# Patient Record
Sex: Male | Born: 2000 | Race: Black or African American | Hispanic: No | Marital: Single | Smoking: Never smoker
Health system: Southern US, Community
[De-identification: ages and names within clinical notes are randomized; demographics above are authoritative.]

## PROBLEM LIST (undated history)

## (undated) DIAGNOSIS — I350 Nonrheumatic aortic (valve) stenosis: Secondary | ICD-10-CM

## (undated) DIAGNOSIS — E119 Type 2 diabetes mellitus without complications: Secondary | ICD-10-CM

## (undated) DIAGNOSIS — F909 Attention-deficit hyperactivity disorder, unspecified type: Secondary | ICD-10-CM

## (undated) HISTORY — DX: Attention-deficit hyperactivity disorder, unspecified type: F90.9

## (undated) HISTORY — DX: Nonrheumatic aortic (valve) stenosis: I35.0

## (undated) HISTORY — PX: CARDIAC SURGERY: SHX584

---
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
## 2021-01-05 ENCOUNTER — Other Ambulatory Visit: Payer: Self-pay

## 2021-01-05 ENCOUNTER — Ambulatory Visit (HOSPITAL_COMMUNITY): Payer: Medicaid Other | Attending: General Surgery | Admitting: Physical Therapy

## 2021-01-05 ENCOUNTER — Encounter (HOSPITAL_COMMUNITY): Payer: Self-pay | Admitting: Physical Therapy

## 2021-01-05 DIAGNOSIS — R2689 Other abnormalities of gait and mobility: Secondary | ICD-10-CM | POA: Insufficient documentation

## 2021-01-05 DIAGNOSIS — R262 Difficulty in walking, not elsewhere classified: Secondary | ICD-10-CM | POA: Insufficient documentation

## 2021-01-05 DIAGNOSIS — M6281 Muscle weakness (generalized): Secondary | ICD-10-CM | POA: Insufficient documentation

## 2021-01-05 DIAGNOSIS — R29898 Other symptoms and signs involving the musculoskeletal system: Secondary | ICD-10-CM | POA: Diagnosis not present

## 2021-01-05 DIAGNOSIS — M25552 Pain in left hip: Secondary | ICD-10-CM | POA: Diagnosis not present

## 2021-01-05 NOTE — Therapy (Signed)
[facility] [address] [place], [place], [address] Phone: [phone]   Fax:  [phone]  Physical Therapy Treatment  Patient Details  Name: Travis Arias MRN: [id number] Date of Birth: 06/16/2000 Referring Provider (PT): Truitt Merle   Encounter Date: 01/05/2021   PT End of Session - 01/05/21 1131     Visit Number 11    Number of Visits 16    Date for PT Re-Evaluation 01/14/21    Authorization Type [employer]    Authorization Time Period 16 visits requested 6/16-8/11/22    Authorization - Visit Number 11    Authorization - Number of Visits 16    Progress Note Due on Visit 19    PT Start Time 1131    PT Stop Time 1210    PT Time Calculation (min) 39 min    Activity Tolerance Patient tolerated treatment well    Behavior During Therapy [facility] for tasks assessed/performed             Past Medical History:  Diagnosis Date   ADHD (attention deficit hyperactivity disorder)    Aortic stenosis    Diabetes mellitus without complication [facility])     Past Surgical History:  Procedure Laterality Date   CARDIAC SURGERY     ORIF ACETABULAR FRACTURE Left 09/04/2020   Procedure: OPEN REDUCTION INTERNAL FIXATION (ORIF) ACETABULAR FRACTURE;  Surgeon: Roby Lofts, MD;  Location: [facility] OR;  Service: Orthopedics;  Laterality: Left;    There were no vitals filed for this visit.   Subjective Assessment - 01/05/21 1132     Subjective Patient states pain in hip following squats with soreness following in glute. He hasnt done squats yet.    Currently in Pain? No/denies                               [facility] Adult PT Treatment/Exercise - 01/05/21 0001       Knee/Hip Exercises: Aerobic   Tread Mill x 3 min 1.2 mph for warm-up incline 2%      Knee/Hip Exercises: Standing   Forward Lunges Both;10 reps;1 set    Lateral Step Up Both;15 reps;Hand Hold: 0;Step Height: 6";2 sets    Lateral Step Up Limitations slow eccentric,  toes forward    Forward Step Up Left;3 sets;10 reps;Step Height: 6";Right    Forward Step Up Limitations contralateral knee drive    Other Standing Knee Exercises lateral stepping with anchored pull 10 # bilateral - cues to stay upright and to take smalled steps 2 sets of 5      Knee/Hip Exercises: Seated   Other Seated Knee/Hip Exercises hip IR iso with belt 5x 5 second holds 2 sets                    PT Education - 01/05/21 1132     Education Details HEP    Person(s) Educated Patient    Methods Explanation    Comprehension Verbalized understanding              PT Short Term Goals - 12/29/20 1148       PT SHORT TERM GOAL #1   Title Patient will be independent in self management strategies to improve quality of life and functional outcomes.    Time 4    Period Weeks    Status On-going    Target Date 12/17/20      PT SHORT  TERM GOAL #2   Title Patient will report at least 25% improvement in symptoms for improved quality of life.    Baseline 75% improvement    Time 4    Period Weeks    Status Achieved    Target Date 12/17/20      PT SHORT TERM GOAL #3   Title Patient will be able to demosntrate at least 30 degress of left hip IR in prone    Baseline 35 degrees left hip IR    Time 4    Period Weeks    Status Achieved    Target Date 12/17/20               PT Long Term Goals - 12/29/20 1150       PT LONG TERM GOAL #1   Title Patient will report at least 75% improvement in symptoms for improved quality of life.    Baseline 75% improvement    Time 8    Period Weeks    Status Achieved      PT LONG TERM GOAL #2   Title Patient will be able to stand on either leg without UE support for at least 10 seconds    Baseline 6 sec LLE SLS    Time 8    Period Weeks    Status On-going      PT LONG TERM GOAL #3   Title Patient will be able to ambulate for at least 60 minutes with pain no greater than 1/10 in order to demonstrate improved ability to  ambulate in the community.    Baseline Pt notes improved activity tolerance and able to spend several hours out in the community    Time 8    Period Weeks    Status Achieved                   Plan - 01/05/21 1131     Clinical Impression Statement Patient begins with ambulation on treadmill with incline for warm up. Patient requires unilateral UE support with step up with knee drive for impaired balance. Patient showing impaired eccentric strength and motor control on LLE with lateral step down exercise despite cueing. Patient overall progressing well with PT. Patient will continue to benefit from skilled physical therapy in order to reduce impairment and improve function.    Personal Factors and Comorbidities Fitness;Comorbidity 2;Past/Current Experience;Behavior Pattern;Time since onset of injury/illness/exacerbation    Comorbidities Obesity, sedentary    Examination-Activity Limitations Locomotion Level;Transfers;Bed Mobility;Sit;Sleep;Squat;Stairs;Stand;Lift;Dressing;Carry;Bend    Examination-Participation Restrictions Cleaning;Meal Prep;Occupation;Church;Community Activity;Shop;Volunteer;Yard Work    Stability/Clinical Decision Making Stable/Uncomplicated    Rehab Potential Good    PT Frequency 2x / week    PT Duration 8 weeks    PT Treatment/Interventions ADLs/Self Care Home Management;Aquatic Therapy;Cryotherapy;Electrical Stimulation;Moist Heat;Iontophoresis 4mg /ml Dexamethasone;Ultrasound;Traction;DME Instruction;Gait training;Stair training;Functional mobility training;Therapeutic activities;Therapeutic exercise;Balance training;Patient/family education;Neuromuscular re-education;Manual techniques;Manual lymph drainage;Compression bandaging;Scar mobilization;Passive range of motion;Dry needling;Energy conservation;Splinting;Taping;Vasopneumatic Device;Spinal Manipulations;Joint Manipulations    PT Next Visit Plan work on hip IR ROM and strengthening.  Progress glute  strengthening to increase stance time on Lt with ambulation.    PT Home Exercise Plan 6/16 butterfly stretch, Hip IR ROM prone, SKC hip flexion stretch; 7/7 quad circles, bridge with band, bent knee extension prone. forward step-ups with knee drive, lateral step-ups    Consulted and Agree with Plan of Care Patient             Patient will benefit from skilled therapeutic intervention in order to improve the following  deficits and impairments:  Abnormal gait, Difficulty walking, Decreased endurance, Decreased activity tolerance, Decreased knowledge of precautions, Pain, Impaired perceived functional ability, Decreased balance, Impaired flexibility, Improper body mechanics, Decreased strength, Decreased mobility  Visit Diagnosis: Pain in left hip  Difficulty in walking, not elsewhere classified  Other abnormalities of gait and mobility  Other symptoms and signs involving the musculoskeletal system  Muscle weakness (generalized)     Problem List Patient Active Problem List   Diagnosis Date Noted   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 [place], [place], [address] Phone: [phone]   Fax:  [phone]  Name: Travis Arias MRN: [id number] Date of Birth: January 09, 2001

## 2021-01-07 ENCOUNTER — Ambulatory Visit (HOSPITAL_COMMUNITY): Payer: Medicaid Other

## 2021-01-07 DIAGNOSIS — S32409A Unspecified fracture of unspecified acetabulum, initial encounter for closed fracture: Secondary | ICD-10-CM | POA: Diagnosis not present

## 2021-01-07 DIAGNOSIS — R531 Weakness: Secondary | ICD-10-CM | POA: Diagnosis not present

## 2021-01-08 ENCOUNTER — Encounter (HOSPITAL_COMMUNITY): Payer: Self-pay

## 2021-01-08 ENCOUNTER — Other Ambulatory Visit: Payer: Self-pay

## 2021-01-08 ENCOUNTER — Ambulatory Visit (HOSPITAL_COMMUNITY): Payer: Medicaid Other

## 2021-01-08 DIAGNOSIS — R29898 Other symptoms and signs involving the musculoskeletal system: Secondary | ICD-10-CM | POA: Diagnosis not present

## 2021-01-08 DIAGNOSIS — R2689 Other abnormalities of gait and mobility: Secondary | ICD-10-CM | POA: Diagnosis not present

## 2021-01-08 DIAGNOSIS — M6281 Muscle weakness (generalized): Secondary | ICD-10-CM | POA: Diagnosis not present

## 2021-01-08 DIAGNOSIS — R262 Difficulty in walking, not elsewhere classified: Secondary | ICD-10-CM | POA: Diagnosis not present

## 2021-01-08 DIAGNOSIS — M25552 Pain in left hip: Secondary | ICD-10-CM | POA: Diagnosis not present

## 2021-01-08 NOTE — Therapy (Signed)
[facility] [address] [place], [place], [address] Phone: [phone]   Fax:  [phone]  Physical Therapy Treatment  Patient Details  Name: WOODROE VOGAN MRN: [id number] Date of Birth: 2001-05-27 Referring Provider (PT): Truitt Merle   Encounter Date: 01/08/2021   PT End of Session - 01/08/21 1336     Visit Number 12    Number of Visits 16    Date for PT Re-Evaluation 01/14/21    Authorization Type [employer]    Authorization Time Period 16 visits requested 6/16-8/11/22    Authorization - Visit Number 12    Authorization - Number of Visits 16    Progress Note Due on Visit 19    PT Start Time 1317    PT Stop Time 1355    PT Time Calculation (min) 38 min    Activity Tolerance Patient tolerated treatment well;Patient limited by fatigue    Behavior During Therapy [facility] for tasks assessed/performed             Past Medical History:  Diagnosis Date   ADHD (attention deficit hyperactivity disorder)    Aortic stenosis    Diabetes mellitus without complication [facility])     Past Surgical History:  Procedure Laterality Date   CARDIAC SURGERY     ORIF ACETABULAR FRACTURE Left 09/04/2020   Procedure: OPEN REDUCTION INTERNAL FIXATION (ORIF) ACETABULAR FRACTURE;  Surgeon: Roby Lofts, MD;  [facility]: [facility] OR;  Service: Orthopedics;  Laterality: Left;    There were no vitals filed for this visit.   Subjective Assessment - 01/08/21 1324     Subjective Feeling good today, no reports of pain today.    Patient Stated Goals like to walk better    Currently in Pain? No/denies                               [facility] Adult PT Treatment/Exercise - 01/08/21 0001       Ambulation/Gait   Ambulation/Gait Yes    Ambulation/Gait Assistance 6: Modified independent (Device/Increase time)    Ambulation Distance (Feet) 376 Feet    Assistive device None    Gait Pattern Decreased stance time - left;Decreased stride  length;Decreased hip/knee flexion - left;Left foot flat;Trendelenburg    Ambulation Surface Level;Indoor    Gait Comments      Knee/Hip Exercises: Standing   Heel Raises 20 reps    Heel Raises Limitations controlled lowering minimal hand use with cueing for ab set    Forward Lunges Both;10 reps;1 set    Forward Lunges Limitations floor wiht ab set minimal hand use    Lateral Step Up Both;2 sets;10 reps;Hand Hold: 1;Step Height: 6";Step Height: 4"    Lateral Step Up Limitations slow eccentric, toes forward    Forward Step Up Left;2 sets;10 reps    Forward Step Up Limitations contralateral knee drive; cueing to reduce hyperextension knee joint    Functional Squat 2 sets;5 reps    Functional Squat Limitations controlled STS front of mirror for equal weight bearing then walk around step for IR 10" holds    Other Standing Knee Exercises lateral stepping with anchored pull 10 # bilateral - cues to stay upright and to take smalled steps 2 sets of 5    Other Standing Knee Exercises crossover step up 6in 10x  PT Short Term Goals - 12/29/20 1148       PT SHORT TERM GOAL #1   Title Patient will be independent in self management strategies to improve quality of life and functional outcomes.    Time 4    Period Weeks    Status On-going    Target Date 12/17/20      PT SHORT TERM GOAL #2   Title Patient will report at least 25% improvement in symptoms for improved quality of life.    Baseline 75% improvement    Time 4    Period Weeks    Status Achieved    Target Date 12/17/20      PT SHORT TERM GOAL #3   Title Patient will be able to demosntrate at least 30 degress of left hip IR in prone    Baseline 35 degrees left hip IR    Time 4    Period Weeks    Status Achieved    Target Date 12/17/20               PT Long Term Goals - 12/29/20 1150       PT LONG TERM GOAL #1   Title Patient will report at least 75% improvement in symptoms for  improved quality of life.    Baseline 75% improvement    Time 8    Period Weeks    Status Achieved      PT LONG TERM GOAL #2   Title Patient will be able to stand on either leg without UE support for at least 10 seconds    Baseline 6 sec LLE SLS    Time 8    Period Weeks    Status On-going      PT LONG TERM GOAL #3   Title Patient will be able to ambulate for at least 60 minutes with pain no greater than 1/10 in order to demonstrate improved ability to ambulate in the community.    Baseline Pt notes improved activity tolerance and able to spend several hours out in the community    Time 8    Period Weeks    Status Achieved                   Plan - 01/08/21 1336     Clinical Impression Statement Began session with with increased cadence and improve weight distribution during gait.  Added walk around exercise for IR that was tolerated well.  STS complete infront of mirror to improve awareness with weight distrution with task with min verbal cueing.  Cueing through session for controlled movements, reduce heavy UE support, demonstration and verbal cueing for mechanics and to improve awareness of knee hyperextension during forward and lateral step up training.  EOS pt limited by fatigue, no reports of pain.    Personal Factors and Comorbidities Fitness;Comorbidity 2;Past/Current Experience;Behavior Pattern;Time since onset of injury/illness/exacerbation    Comorbidities Obesity, sedentary    Examination-Activity Limitations Locomotion Level;Transfers;Bed Mobility;Sit;Sleep;Squat;Stairs;Stand;Lift;Dressing;Carry;Bend    Examination-Participation Restrictions Cleaning;Meal Prep;Occupation;Church;Community Activity;Shop;Volunteer;Yard Work    Stability/Clinical Decision Making Stable/Uncomplicated    Optometrist Low    Rehab Potential Good    PT Frequency 2x / week    PT Duration 8 weeks    PT Treatment/Interventions ADLs/Self Care Home Management;Aquatic  Therapy;Cryotherapy;Electrical Stimulation;Moist Heat;Iontophoresis 4mg /ml Dexamethasone;Ultrasound;Traction;DME Instruction;Gait training;Stair training;Functional mobility training;Therapeutic activities;Therapeutic exercise;Balance training;Patient/family education;Neuromuscular re-education;Manual techniques;Manual lymph drainage;Compression bandaging;Scar mobilization;Passive range of motion;Dry needling;Energy conservation;Splinting;Taping;Vasopneumatic Device;Spinal Manipulations;Joint Manipulations    PT Next Visit Plan  work on hip IR ROM and strengthening.  Progress glute strengthening to increase stance time on Lt with ambulation.    PT Home Exercise Plan 6/16 butterfly stretch, Hip IR ROM prone, SKC hip flexion stretch; 7/7 quad circles, bridge with band, bent knee extension prone. forward step-ups with knee drive, lateral step-ups    Consulted and Agree with Plan of Care Patient             Patient will benefit from skilled therapeutic intervention in order to improve the following deficits and impairments:  Abnormal gait, Difficulty walking, Decreased endurance, Decreased activity tolerance, Decreased knowledge of precautions, Pain, Impaired perceived functional ability, Decreased balance, Impaired flexibility, Improper body mechanics, Decreased strength, Decreased mobility  Visit Diagnosis: Pain in left hip  Difficulty in walking, not elsewhere classified  Other abnormalities of gait and mobility  Other symptoms and signs involving the musculoskeletal system  Muscle weakness (generalized)     Problem List Patient Active Problem List   Diagnosis Date Noted   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

## 2021-01-12 ENCOUNTER — Encounter (HOSPITAL_COMMUNITY): Payer: Self-pay | Admitting: Physical Therapy

## 2021-01-12 ENCOUNTER — Ambulatory Visit (HOSPITAL_COMMUNITY): Payer: Medicaid Other | Admitting: Physical Therapy

## 2021-01-12 ENCOUNTER — Other Ambulatory Visit: Payer: Self-pay

## 2021-01-12 DIAGNOSIS — R29898 Other symptoms and signs involving the musculoskeletal system: Secondary | ICD-10-CM

## 2021-01-12 DIAGNOSIS — M6281 Muscle weakness (generalized): Secondary | ICD-10-CM | POA: Diagnosis not present

## 2021-01-12 DIAGNOSIS — R2689 Other abnormalities of gait and mobility: Secondary | ICD-10-CM

## 2021-01-12 DIAGNOSIS — R262 Difficulty in walking, not elsewhere classified: Secondary | ICD-10-CM | POA: Diagnosis not present

## 2021-01-12 DIAGNOSIS — M25552 Pain in left hip: Secondary | ICD-10-CM | POA: Diagnosis not present
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
## 2021-01-14 ENCOUNTER — Other Ambulatory Visit: Payer: Self-pay

## 2021-01-14 ENCOUNTER — Ambulatory Visit (HOSPITAL_COMMUNITY): Payer: Medicaid Other | Admitting: Physical Therapy

## 2021-01-14 ENCOUNTER — Encounter (HOSPITAL_COMMUNITY): Payer: Self-pay | Admitting: Physical Therapy

## 2021-01-14 DIAGNOSIS — M25552 Pain in left hip: Secondary | ICD-10-CM | POA: Diagnosis not present

## 2021-01-14 DIAGNOSIS — R2689 Other abnormalities of gait and mobility: Secondary | ICD-10-CM | POA: Diagnosis not present

## 2021-01-14 DIAGNOSIS — R29898 Other symptoms and signs involving the musculoskeletal system: Secondary | ICD-10-CM | POA: Diagnosis not present

## 2021-01-14 DIAGNOSIS — R262 Difficulty in walking, not elsewhere classified: Secondary | ICD-10-CM

## 2021-01-14 DIAGNOSIS — M6281 Muscle weakness (generalized): Secondary | ICD-10-CM | POA: Diagnosis not present
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
## 2021-02-07 DIAGNOSIS — R531 Weakness: Secondary | ICD-10-CM | POA: Diagnosis not present

## 2021-02-07 DIAGNOSIS — S32409A Unspecified fracture of unspecified acetabulum, initial encounter for closed fracture: Secondary | ICD-10-CM | POA: Diagnosis not present

## 2021-03-09 DIAGNOSIS — S32409A Unspecified fracture of unspecified acetabulum, initial encounter for closed fracture: Secondary | ICD-10-CM | POA: Diagnosis not present

## 2021-03-09 DIAGNOSIS — R531 Weakness: Secondary | ICD-10-CM | POA: Diagnosis not present

## 2021-03-30 DIAGNOSIS — S32452D Displaced transverse fracture of left acetabulum, subsequent encounter for fracture with routine healing: Secondary | ICD-10-CM | POA: Diagnosis not present

## 2021-04-09 DIAGNOSIS — R531 Weakness: Secondary | ICD-10-CM | POA: Diagnosis not present

## 2021-04-09 DIAGNOSIS — S32409A Unspecified fracture of unspecified acetabulum, initial encounter for closed fracture: Secondary | ICD-10-CM | POA: Diagnosis not present

## 2021-05-09 DIAGNOSIS — S32409A Unspecified fracture of unspecified acetabulum, initial encounter for closed fracture: Secondary | ICD-10-CM | POA: Diagnosis not present

## 2021-05-09 DIAGNOSIS — R531 Weakness: Secondary | ICD-10-CM | POA: Diagnosis not present

## 2021-06-09 DIAGNOSIS — S32409A Unspecified fracture of unspecified acetabulum, initial encounter for closed fracture: Secondary | ICD-10-CM | POA: Diagnosis not present

## 2021-06-09 DIAGNOSIS — R531 Weakness: Secondary | ICD-10-CM | POA: Diagnosis not present

## 2021-07-10 DIAGNOSIS — R531 Weakness: Secondary | ICD-10-CM | POA: Diagnosis not present

## 2021-07-10 DIAGNOSIS — S32409A Unspecified fracture of unspecified acetabulum, initial encounter for closed fracture: Secondary | ICD-10-CM | POA: Diagnosis not present

## 2021-07-13 DIAGNOSIS — R070 Pain in throat: Secondary | ICD-10-CM | POA: Diagnosis not present

## 2021-07-13 DIAGNOSIS — R42 Dizziness and giddiness: Secondary | ICD-10-CM | POA: Diagnosis not present

## 2021-08-26 ENCOUNTER — Ambulatory Visit (INDEPENDENT_AMBULATORY_CARE_PROVIDER_SITE_OTHER): Payer: Medicaid Other | Admitting: Family Medicine

## 2021-08-26 ENCOUNTER — Other Ambulatory Visit: Payer: Self-pay

## 2021-08-26 VITALS — BP 122/80 | HR 91 | Temp 98.6°F | Ht 67.0 in | Wt 353.2 lb

## 2021-08-26 DIAGNOSIS — D649 Anemia, unspecified: Secondary | ICD-10-CM | POA: Diagnosis not present

## 2021-08-26 DIAGNOSIS — Z13220 Encounter for screening for lipoid disorders: Secondary | ICD-10-CM

## 2021-08-26 DIAGNOSIS — E119 Type 2 diabetes mellitus without complications: Secondary | ICD-10-CM | POA: Diagnosis not present

## 2021-08-26 MED ORDER — OZEMPIC (0.25 OR 0.5 MG/DOSE) 2 MG/1.5ML ~~LOC~~ SOPN: 0.2500 mg | PEN_INJECTOR | SUBCUTANEOUS | 0 refills | Status: DC

## 2021-08-26 NOTE — Assessment & Plan Note (Signed)
History of anemia.  Needs labs. ?
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
## 2021-08-26 NOTE — Assessment & Plan Note (Signed)
Labs today.  Unsure of control. ?

## 2021-08-26 NOTE — Patient Instructions (Signed)
Labs today. ? ?Will see if we can get medication approved. ? ?I have placed a referral to nutrition. They will be in contact. ? ?Follow up in 3 months. ?

## 2021-08-26 NOTE — Assessment & Plan Note (Signed)
Needs weight loss.  Had a lengthy discussion today regarding need for dietary changes and weight loss.  Discussed weight loss medication.  Also discussed the possibility of bariatric surgery.  Referring to nutrition.  We will see if patient can get approved for semaglutide. ?

## 2021-08-27 ENCOUNTER — Telehealth: Payer: Self-pay | Admitting: Family Medicine

## 2021-08-27 LAB — CMP14+EGFR
ALT: 20 IU/L (ref 0–44)
AST: 18 IU/L (ref 0–40)
Albumin/Globulin Ratio: 1.9 (ref 1.2–2.2)
Albumin: 4.6 g/dL (ref 4.1–5.2)
Alkaline Phosphatase: 102 IU/L (ref 51–125)
BUN/Creatinine Ratio: 15 (ref 9–20)
BUN: 13 mg/dL (ref 6–20)
Bilirubin Total: 0.2 mg/dL (ref 0.0–1.2)
CO2: 24 mmol/L (ref 20–29)
Calcium: 9.8 mg/dL (ref 8.7–10.2)
Chloride: 103 mmol/L (ref 96–106)
Creatinine, Ser: 0.85 mg/dL (ref 0.76–1.27)
Globulin, Total: 2.4 g/dL (ref 1.5–4.5)
Glucose: 99 mg/dL (ref 70–99)
Potassium: 5.2 mmol/L (ref 3.5–5.2)
Sodium: 141 mmol/L (ref 134–144)
Total Protein: 7 g/dL (ref 6.0–8.5)
eGFR: 128 mL/min/{1.73_m2} (ref >59)

## 2021-08-27 LAB — LIPID PANEL
Chol/HDL Ratio: 4.2 ratio (ref 0.0–5.0)
Cholesterol, Total: 151 mg/dL (ref 100–199)
HDL: 36 mg/dL — ABNORMAL LOW (ref >39)
LDL Chol Calc (NIH): 102 mg/dL — ABNORMAL HIGH (ref 0–99)
Triglycerides: 63 mg/dL (ref 0–149)
VLDL Cholesterol Cal: 13 mg/dL (ref 5–40)

## 2021-08-27 LAB — CBC
Hematocrit: 44 % (ref 37.5–51.0)
Hemoglobin: 14.5 g/dL (ref 13.0–17.7)
MCH: 27.7 pg (ref 26.6–33.0)
MCHC: 33 g/dL (ref 31.5–35.7)
MCV: 84 fL (ref 79–97)
Platelets: 458 10*3/uL — ABNORMAL HIGH (ref 150–450)
RBC: 5.24 x10E6/uL (ref 4.14–5.80)
RDW: 13 % (ref 11.6–15.4)
WBC: 5 10*3/uL (ref 3.4–10.8)

## 2021-08-27 LAB — IRON,TIBC AND FERRITIN PANEL
Ferritin: 83 ng/mL (ref 30–400)
Iron Saturation: 30 % (ref 15–55)
Iron: 98 ug/dL (ref 38–169)
Total Iron Binding Capacity: 330 ug/dL (ref 250–450)
UIBC: 232 ug/dL (ref 111–343)

## 2021-08-27 LAB — MICROALBUMIN / CREATININE URINE RATIO
Creatinine, Urine: 255.9 mg/dL
Microalb/Creat Ratio: 5 mg/g creat (ref 0–29)
Microalbumin, Urine: 12.2 ug/mL

## 2021-08-27 LAB — HEMOGLOBIN A1C
Est. average glucose Bld gHb Est-mCnc: 131 mg/dL
Hgb A1c MFr Bld: 6.2 % — ABNORMAL HIGH (ref 4.8–5.6)

## 2021-08-27 LAB — FOLATE: Folate: 7.2 ng/mL (ref >3.0)

## 2021-08-27 LAB — VITAMIN B12: Vitamin B-12: 319 pg/mL (ref 232–1245)

## 2021-08-27 NOTE — Telephone Encounter (Signed)
Ozempic 2mg /1.80ml has been approved from 08/27/21-08/28/22.  ?

## 2021-10-04 ENCOUNTER — Ambulatory Visit: Payer: Medicaid Other | Admitting: Nutrition

## 2021-10-06 ENCOUNTER — Other Ambulatory Visit: Payer: Self-pay | Admitting: Family Medicine

## 2021-10-07 ENCOUNTER — Other Ambulatory Visit: Payer: Self-pay | Admitting: Family Medicine

## 2021-10-07 MED ORDER — OZEMPIC (0.25 OR 0.5 MG/DOSE) 2 MG/1.5ML ~~LOC~~ SOPN: 0.5000 mg | PEN_INJECTOR | SUBCUTANEOUS | 0 refills | Status: DC

## 2021-11-03 ENCOUNTER — Ambulatory Visit: Payer: Medicaid Other | Admitting: Nutrition

## 2021-11-26 ENCOUNTER — Ambulatory Visit (INDEPENDENT_AMBULATORY_CARE_PROVIDER_SITE_OTHER): Payer: Medicaid Other | Admitting: Family Medicine

## 2021-11-26 MED ORDER — SEMAGLUTIDE (1 MG/DOSE) 4 MG/3ML ~~LOC~~ SOPN: 1.0000 mg | PEN_INJECTOR | SUBCUTANEOUS | 0 refills | Status: DC

## 2021-11-29 NOTE — Assessment & Plan Note (Signed)
Advised to increase semaglutide.  Patient is to increase to 0.5 mg weekly and then subsequently to 1 mg weekly.

## 2021-12-14 ENCOUNTER — Other Ambulatory Visit: Payer: Self-pay | Admitting: Family Medicine

## 2022-01-19 ENCOUNTER — Other Ambulatory Visit: Payer: Self-pay | Admitting: Family Medicine

## 2022-01-20 NOTE — Telephone Encounter (Signed)
Left message to return call 

## 2022-02-16 ENCOUNTER — Other Ambulatory Visit: Payer: Self-pay | Admitting: Family Medicine

## 2022-02-28 ENCOUNTER — Ambulatory Visit: Payer: Medicaid Other | Admitting: Family Medicine

## 2022-03-02 ENCOUNTER — Ambulatory Visit: Payer: Medicaid Other | Admitting: Family Medicine

## 2022-03-30 ENCOUNTER — Ambulatory Visit (INDEPENDENT_AMBULATORY_CARE_PROVIDER_SITE_OTHER): Payer: Self-pay | Admitting: Family Medicine

## 2022-03-30 VITALS — BP 112/75 | HR 87 | Temp 98.2°F | Ht 67.0 in | Wt 352.0 lb

## 2022-03-30 DIAGNOSIS — Z23 Encounter for immunization: Secondary | ICD-10-CM

## 2022-03-30 DIAGNOSIS — E119 Type 2 diabetes mellitus without complications: Secondary | ICD-10-CM

## 2022-03-30 DIAGNOSIS — E785 Hyperlipidemia, unspecified: Secondary | ICD-10-CM

## 2022-03-30 MED ORDER — SEMAGLUTIDE (2 MG/DOSE) 8 MG/3ML ~~LOC~~ SOPN: 2.0000 mg | PEN_INJECTOR | SUBCUTANEOUS | 6 refills | Status: DC

## 2022-03-30 NOTE — Patient Instructions (Signed)
A1C today.  I have increased the Ozempic.  Consider weight loss surgery.  Take care  Dr. Lacinda Axon

## 2022-03-31 DIAGNOSIS — E785 Hyperlipidemia, unspecified: Secondary | ICD-10-CM | POA: Insufficient documentation

## 2022-03-31 LAB — HEMOGLOBIN A1C
Est. average glucose Bld gHb Est-mCnc: 117 mg/dL
Hgb A1c MFr Bld: 5.7 % — ABNORMAL HIGH (ref 4.8–5.6)

## 2022-03-31 NOTE — Assessment & Plan Note (Addendum)
Mildly elevated LDL.  Will likely resolve with weight loss.

## 2022-03-31 NOTE — Assessment & Plan Note (Signed)
Increase in semaglutide.  Discussed weight loss surgery.  Patient wants to wait at this time.

## 2022-03-31 NOTE — Progress Notes (Signed)
Subjective:  Patient ID: Travis Arias, male    DOB: [birth year]  Age: 21 y.o. MRN: [id number]  CC: Chief Complaint  Patient presents with   Diabetes   Obesity    HPI:  21 year old male with morbid obesity, hyperlipidemia, and type 2 diabetes presents for follow-up.  Patient has not had any significant weight loss on Ozempic.  A1c has been well controlled.  He is currently on the 1 mg dose.  We will discuss increasing today.  Patient's main health issue is morbid obesity.  Will discuss weight loss surgery today.  Hyperlipidemia is mild.  No pharmacotherapy at this time given age.  He states that he is feeling well.  He has no significant complaints or concerns at this time.  Patient Active Problem List   Diagnosis Date Noted   Hyperlipidemia 03/31/2022   Type 2 diabetes mellitus without complication, without long-term current use of insulin ([place]) 08/26/2021   Morbid obesity ([place]) 08/26/2021    Social Hx   Social History   Socioeconomic History   Marital status: Single    Spouse name: Not on file   Number of children: Not on file   Years of education: Not on file   Highest education level: Not on file  Occupational History   Not on file  Tobacco Use   Smoking status: Never   Smokeless tobacco: Never  Substance and Sexual Activity   Alcohol use: No   Drug use: No   Sexual activity: Not on file  Other Topics Concern   Not on file  Social History Narrative   Not on file   Social Determinants of Health   Financial Resource Strain: Not on file  Food Insecurity: Not on file  Transportation Needs: Not on file  Physical Activity: Not on file  Stress: Not on file  Social Connections: Not on file    Review of Systems  Constitutional: Negative.   Respiratory: Negative.    Cardiovascular: Negative.    Objective:  BP 112/75   Pulse 87   Temp 98.2 F (36.8 C)   Ht 5\' 7"  (1.702 m)   Wt (!) 352 lb (159.7 kg)   SpO2 100%   BMI 55.13 kg/m      03/30/2022     3:17 PM 11/26/2021   10:11 AM 08/26/2021    1:28 PM  BP/Weight  Systolic BP 323 557 322  Diastolic BP 75 76 80  Wt. (Lbs) 352 350 353.2  BMI 55.13 kg/m2 54.82 kg/m2 55.32 kg/m2    Physical Exam Vitals and nursing note reviewed.  Constitutional:      Appearance: Normal appearance. He is obese.  HENT:     Head: Normocephalic and atraumatic.  Cardiovascular:     Rate and Rhythm: Normal rate and regular rhythm.  Pulmonary:     Effort: Pulmonary effort is normal.     Breath sounds: Normal breath sounds. No wheezing, rhonchi or rales.  Neurological:     Mental Status: He is alert.    Lab Results  Component Value Date   WBC 5.0 08/26/2021   HGB 14.5 08/26/2021   HCT 44.0 08/26/2021   PLT 458 (H) 08/26/2021   GLUCOSE 99 08/26/2021   CHOL 151 08/26/2021   TRIG 63 08/26/2021   HDL 36 (L) 08/26/2021   LDLCALC 102 (H) 08/26/2021   ALT 20 08/26/2021   AST 18 08/26/2021   NA 141 08/26/2021   K 5.2 08/26/2021   CL 103 08/26/2021   CREATININE 0.85  08/26/2021   BUN 13 08/26/2021   CO2 24 08/26/2021   TSH 1.650 07/31/2019   HGBA1C 5.7 (H) 03/30/2022     Assessment & Plan:   Problem List Items Addressed This Visit       Endocrine   Type 2 diabetes mellitus without complication, without long-term current use of insulin ([place]) - Primary    Well-controlled.  A1c was obtained and was 5.7. Increasing semaglutide to help with weight loss.      Relevant Medications   Semaglutide, 2 MG/DOSE, 8 MG/3ML SOPN   Other Relevant Orders   Hemoglobin A1c (Completed)     Other   Morbid obesity (HCC)    Increase in semaglutide.  Discussed weight loss surgery.  Patient wants to wait at this time.      Relevant Medications   Semaglutide, 2 MG/DOSE, 8 MG/3ML SOPN   Hyperlipidemia    Mildly elevated LDL.  Will likely resolve with weight loss.      Other Visit Diagnoses     Immunization due       Relevant Orders   Flu Vaccine QUAD 53mo+IM (Fluarix, Fluzone & Alfiuria Quad PF)  (Completed)       Meds ordered this encounter  Medications   Semaglutide, 2 MG/DOSE, 8 MG/3ML SOPN    Sig: Inject 2 mg as directed once a week.    Dispense:  3 mL    Refill:  [place]

## 2022-03-31 NOTE — Assessment & Plan Note (Signed)
Well-controlled.  A1c was obtained and was 5.7. Increasing semaglutide to help with weight loss.

## 2022-07-11 ENCOUNTER — Telehealth: Payer: Self-pay | Admitting: *Deleted

## 2022-07-11 NOTE — Telephone Encounter (Signed)
New insurance hs still not approved Ozempic and patient has been without med for 5 or more weeks- mom (DPR) would like to know if there is something else you would recommend that they may cover that would help with diabetes and weight  [employer]

## 2022-07-11 NOTE — Telephone Encounter (Signed)
Coral Spikes, DO     I can try another GLP 1. I would recommend that he consider bariatric surgery as well.  Dr. Lacinda Axon

## 2022-07-12 ENCOUNTER — Other Ambulatory Visit: Payer: Self-pay | Admitting: Family Medicine

## 2022-07-12 MED ORDER — TRULICITY 3 MG/0.5ML ~~LOC~~ SOAJ: 3.0000 mg | SUBCUTANEOUS | 0 refills | Status: DC

## 2022-07-13 ENCOUNTER — Telehealth: Payer: Self-pay | Admitting: Family Medicine

## 2022-07-13 NOTE — Telephone Encounter (Signed)
Contacted mom to see if she wanted [place] to complete PA for Ozempic or Mounjaro. Mom states we can do whichever PA; Trulicity has been sent in but needing PA also. PA for Trulicity has been attempted; await decision from insurance

## 2022-07-13 NOTE — Telephone Encounter (Signed)
PA for Trulicity completed through [email or web address]. ID number for PA [id number]. Await decision from insurance

## 2022-07-13 NOTE — Telephone Encounter (Signed)
Pt mom called and was wanting to know if Darcel Bayley was covered. Per phone message on 11/11/01 Trulicity was sent in. Mom states that she is on [facility] and has same issues so it should be covered. Advised mom that we would try again through [facility] and let her know determination. Mom states if she does not answer we can leave voicemail.

## 2022-07-13 NOTE — Telephone Encounter (Signed)
Coral Spikes, DO     Rx sent for Trulicity.    Mother(DPR) aware

## 2022-07-21 NOTE — Telephone Encounter (Signed)
Sent mychart message to patient

## 2022-07-21 NOTE — Telephone Encounter (Signed)
Additional information sent yesterday

## 2022-07-21 NOTE — Telephone Encounter (Signed)
Trulicity has been denied per insurance. Please advise. Thank you

## 2022-07-22 IMAGING — RF DG C-ARM 1-60 MIN
1 series · 15 of 18 positions shown · non-contrast
Comparison: None.

CLINICAL DATA: ORIF acetabular fracture.

EXAM:
DG C-ARM 1-60 MIN; JUDET PELVIS - 3+ VIEW
FLUOROSCOPY TIME:  Fluoroscopy Time:  337.4 seconds.
Radiation Exposure Index (if provided by the fluoroscopic device):
477 mGy.
Number of Acquired Spot Images: 18 images.

[Series 1: run · 15 of 18 slices shown]
[im 1/18]
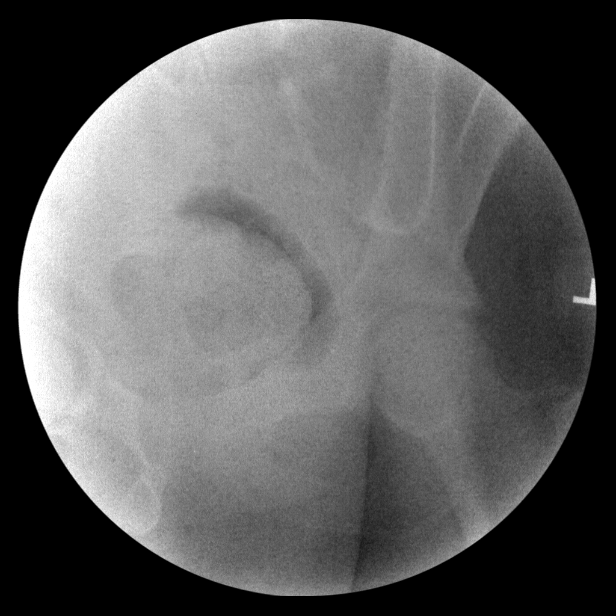
[im 2/18]
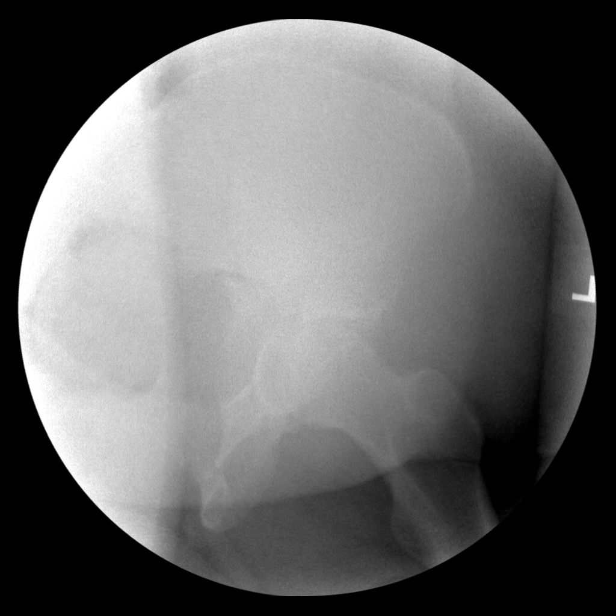
[im 4/18]
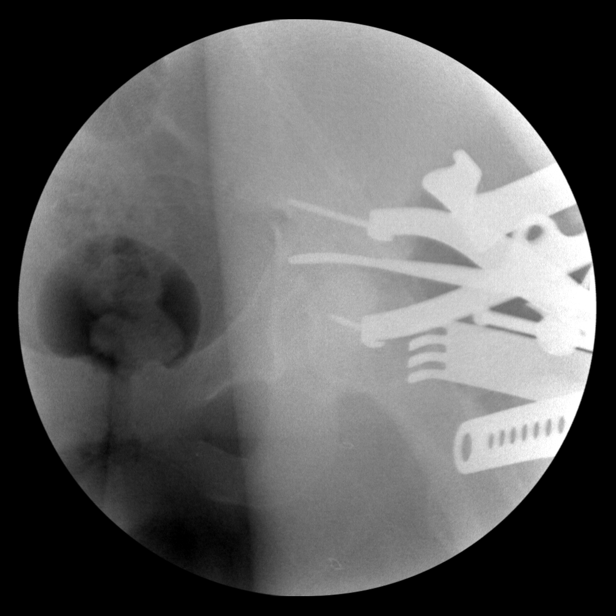
[im 5/18]
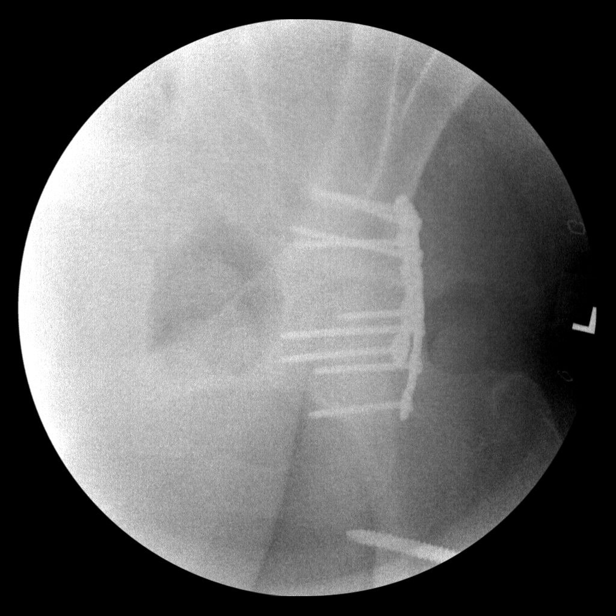
[im 6/18]
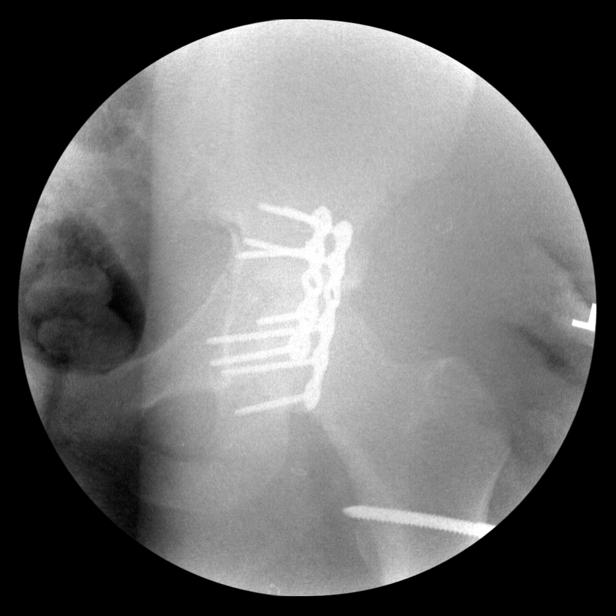
[im 7/18]
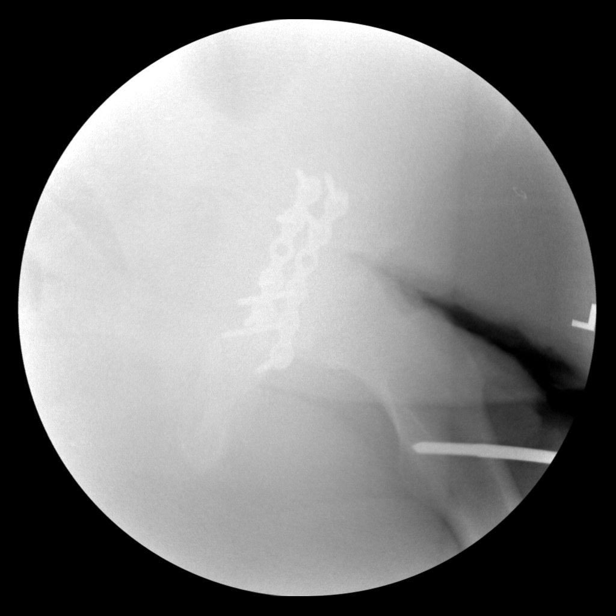
[im 8/18]
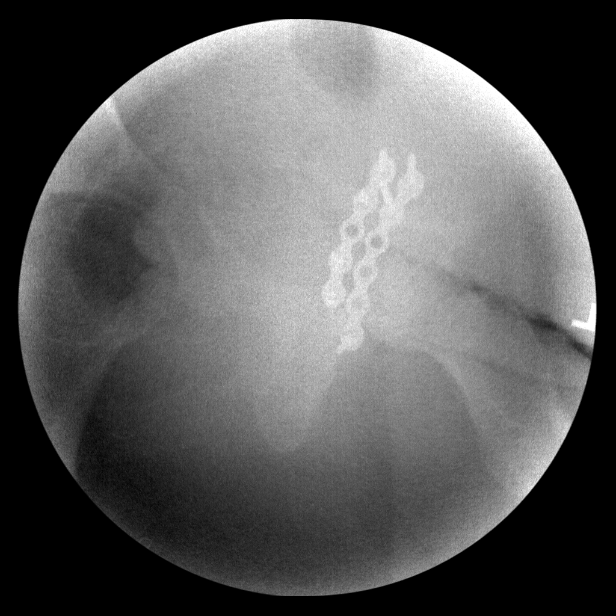
[im 10/18]
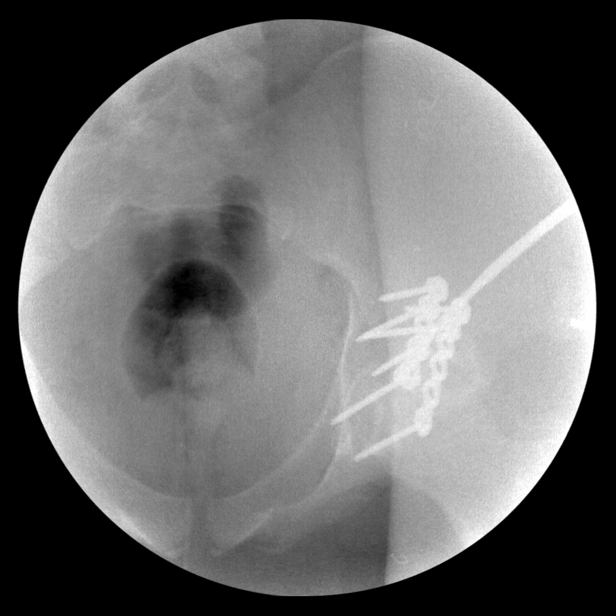
[im 11/18]
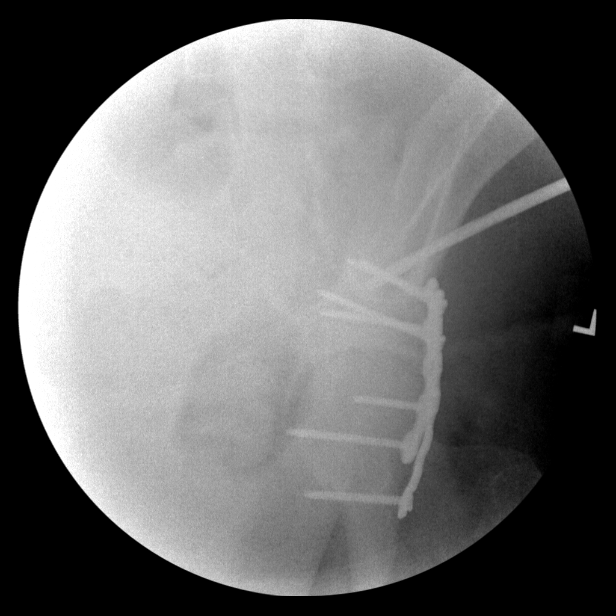
[im 12/18]
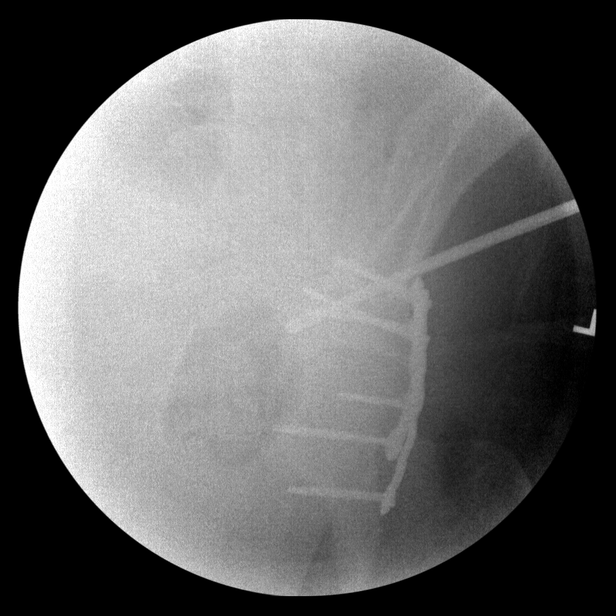
[im 13/18]
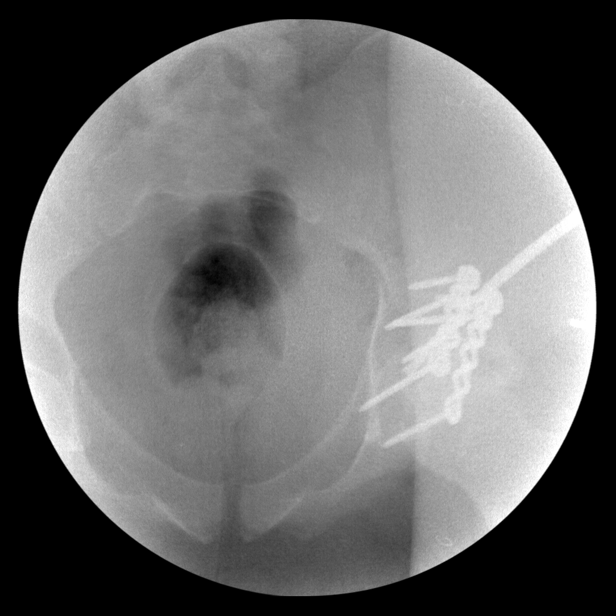
[im 14/18]
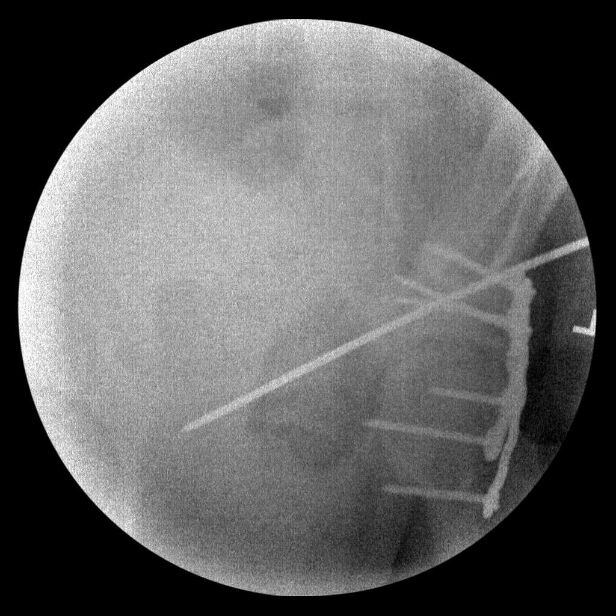
[im 16/18]
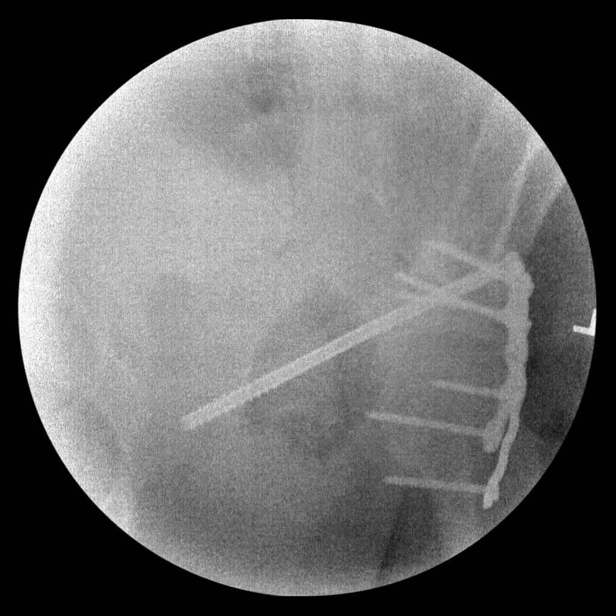
[im 17/18]
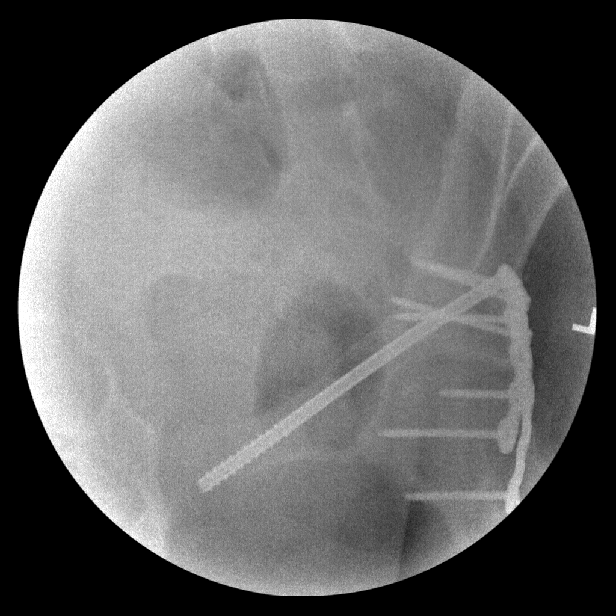
[im 18/18]
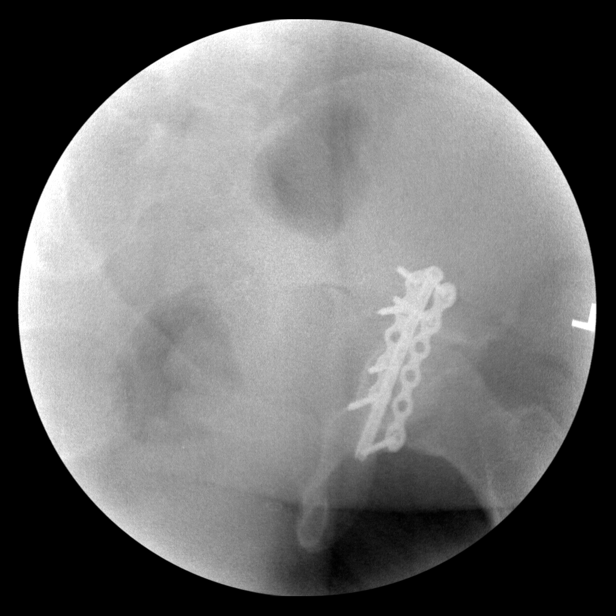

[15 of 18 positions shown; findings below may reference images not displayed]

FINDINGS: Eighteen C-arm fluoroscopic images were obtained intraoperatively
and submitted for post operative interpretation. These images
demonstrate fixation of a left acetabular fracture with plate and
screws along the posterior column and placement of an anterior
column screw. Please see the performing provider's procedural report
for further detail.
IMPRESSION: Intraoperative fluoroscopy, as detailed above.

## 2022-07-22 IMAGING — RF DG PELVIS 3+V JUDET
1 series · 15 of 18 positions shown · non-contrast
Comparison: None.

CLINICAL DATA: ORIF acetabular fracture.

EXAM:
DG C-ARM 1-60 MIN; JUDET PELVIS - 3+ VIEW
FLUOROSCOPY TIME:  Fluoroscopy Time:  337.4 seconds.
Radiation Exposure Index (if provided by the fluoroscopic device):
477 mGy.
Number of Acquired Spot Images: 18 images.

[Series 1: run · 15 of 18 slices shown]
[im 1/18]
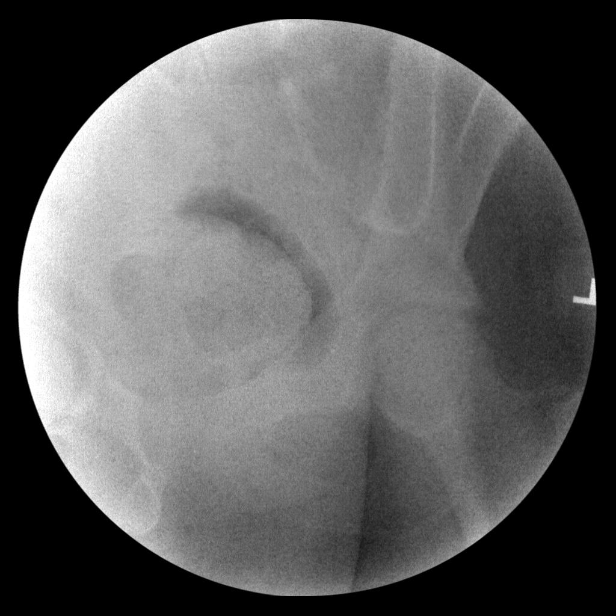
[im 2/18]
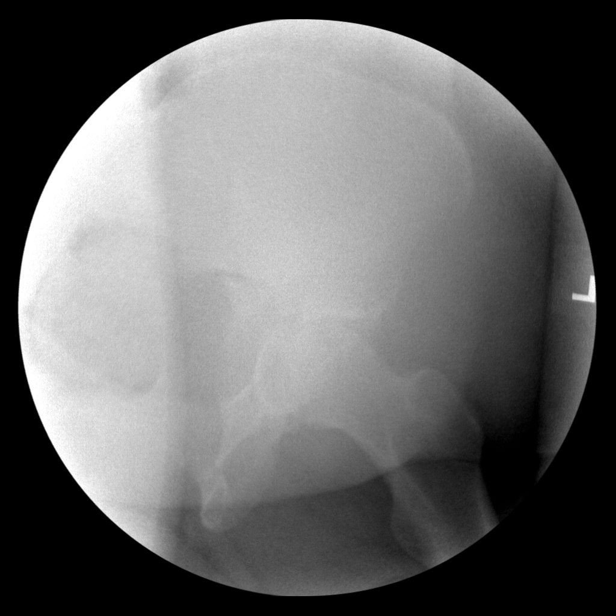
[im 4/18]
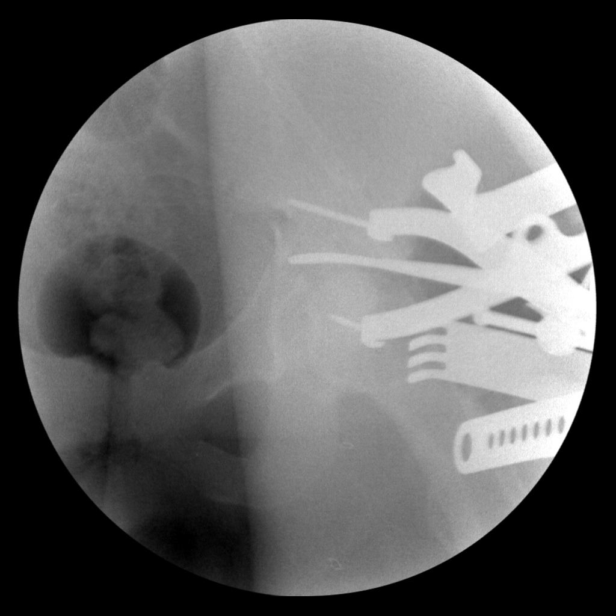
[im 5/18]
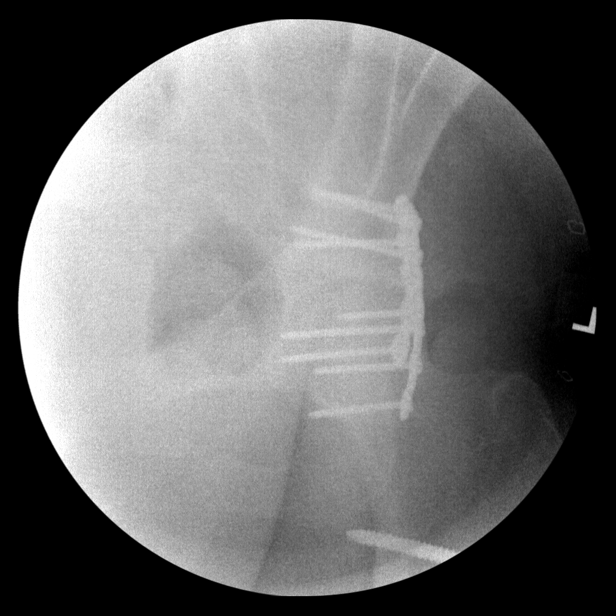
[im 6/18]
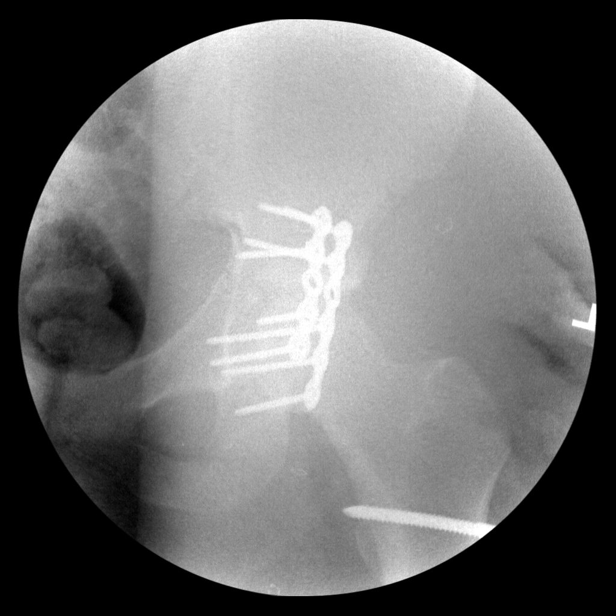
[im 7/18]
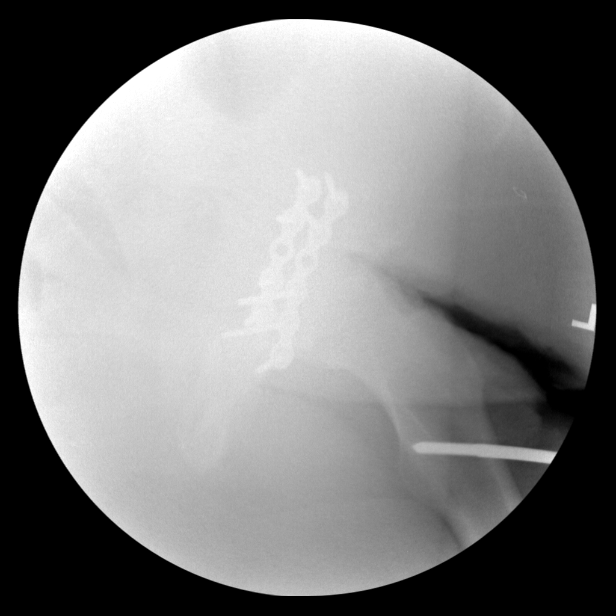
[im 8/18]
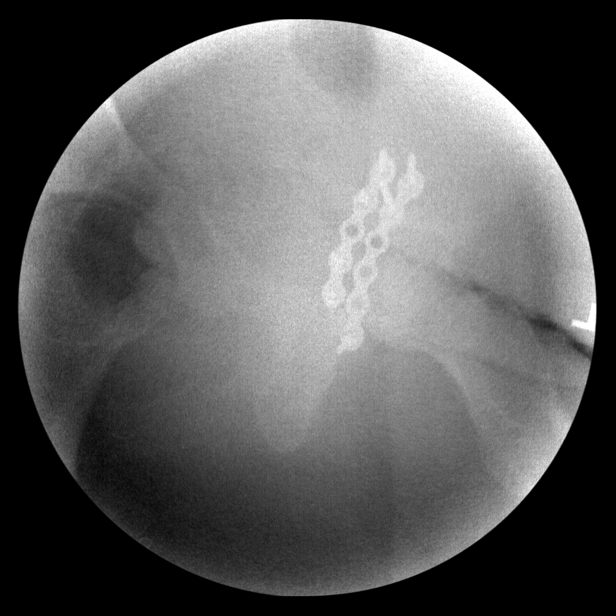
[im 10/18]
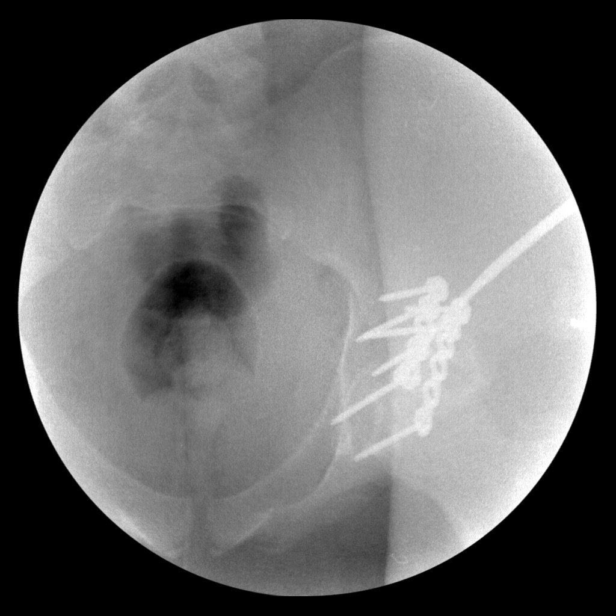
[im 11/18]
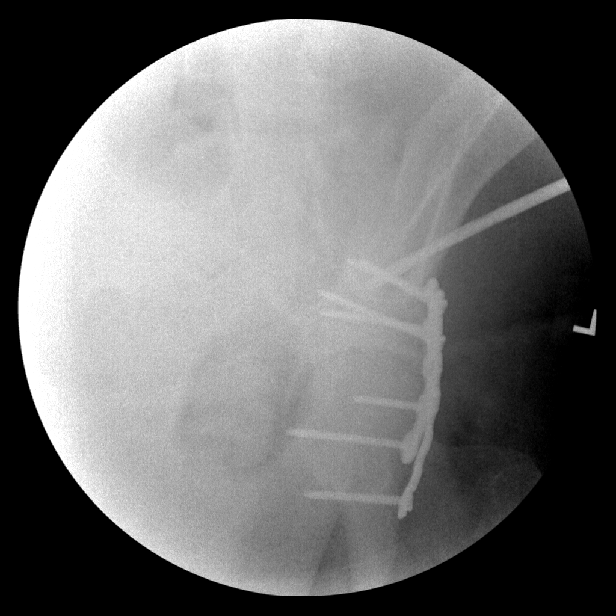
[im 12/18]
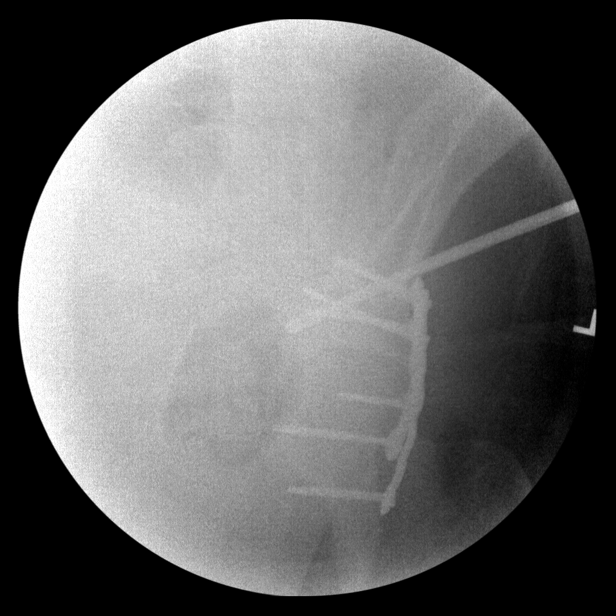
[im 13/18]
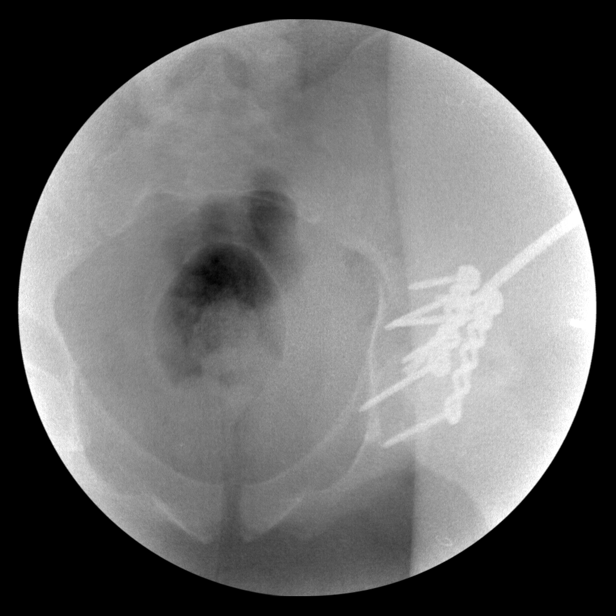
[im 14/18]
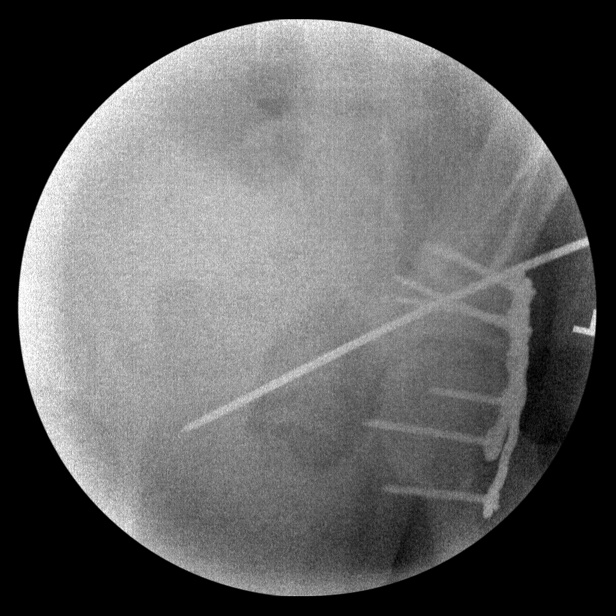
[im 16/18]
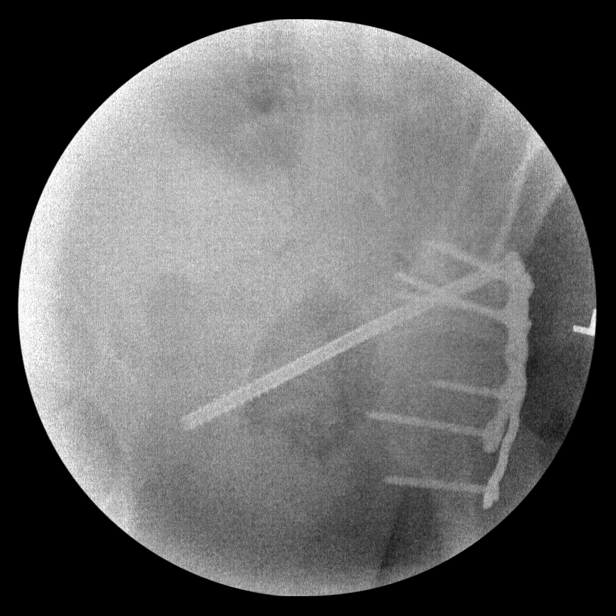
[im 17/18]
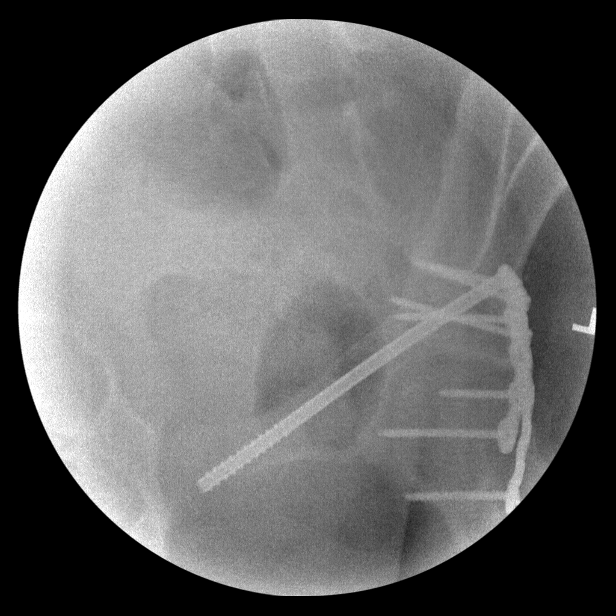
[im 18/18]
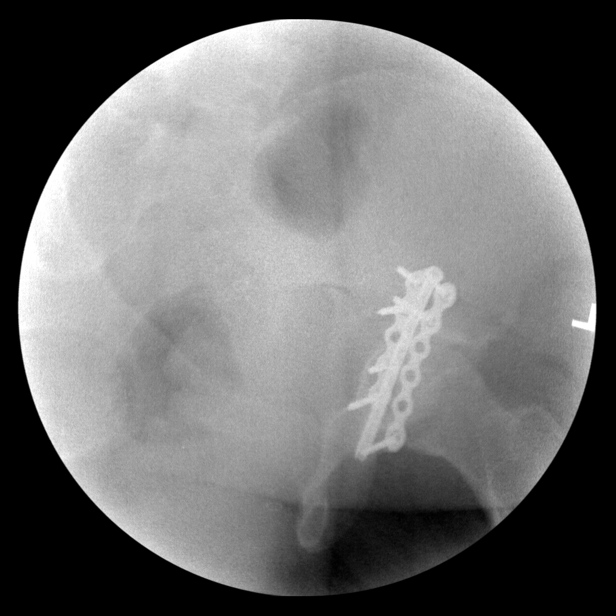

[15 of 18 positions shown; findings below may reference images not displayed]

FINDINGS: Eighteen C-arm fluoroscopic images were obtained intraoperatively
and submitted for post operative interpretation. These images
demonstrate fixation of a left acetabular fracture with plate and
screws along the posterior column and placement of an anterior
column screw. Please see the performing provider's procedural report
for further detail.
IMPRESSION: Intraoperative fluoroscopy, as detailed above.

## 2022-07-22 IMAGING — DX DG PELVIS 3+V JUDET
3 series · 3 of 3 positions shown · non-contrast
Comparison: Intraoperative fluoroscopic study dated 09/04/2020 and
CT dated 09/02/2020.

CLINICAL DATA: 20-year-old male with acetabular fracture.

EXAM:
JUDET PELVIS - 3+ VIEW

[pelvis ap]
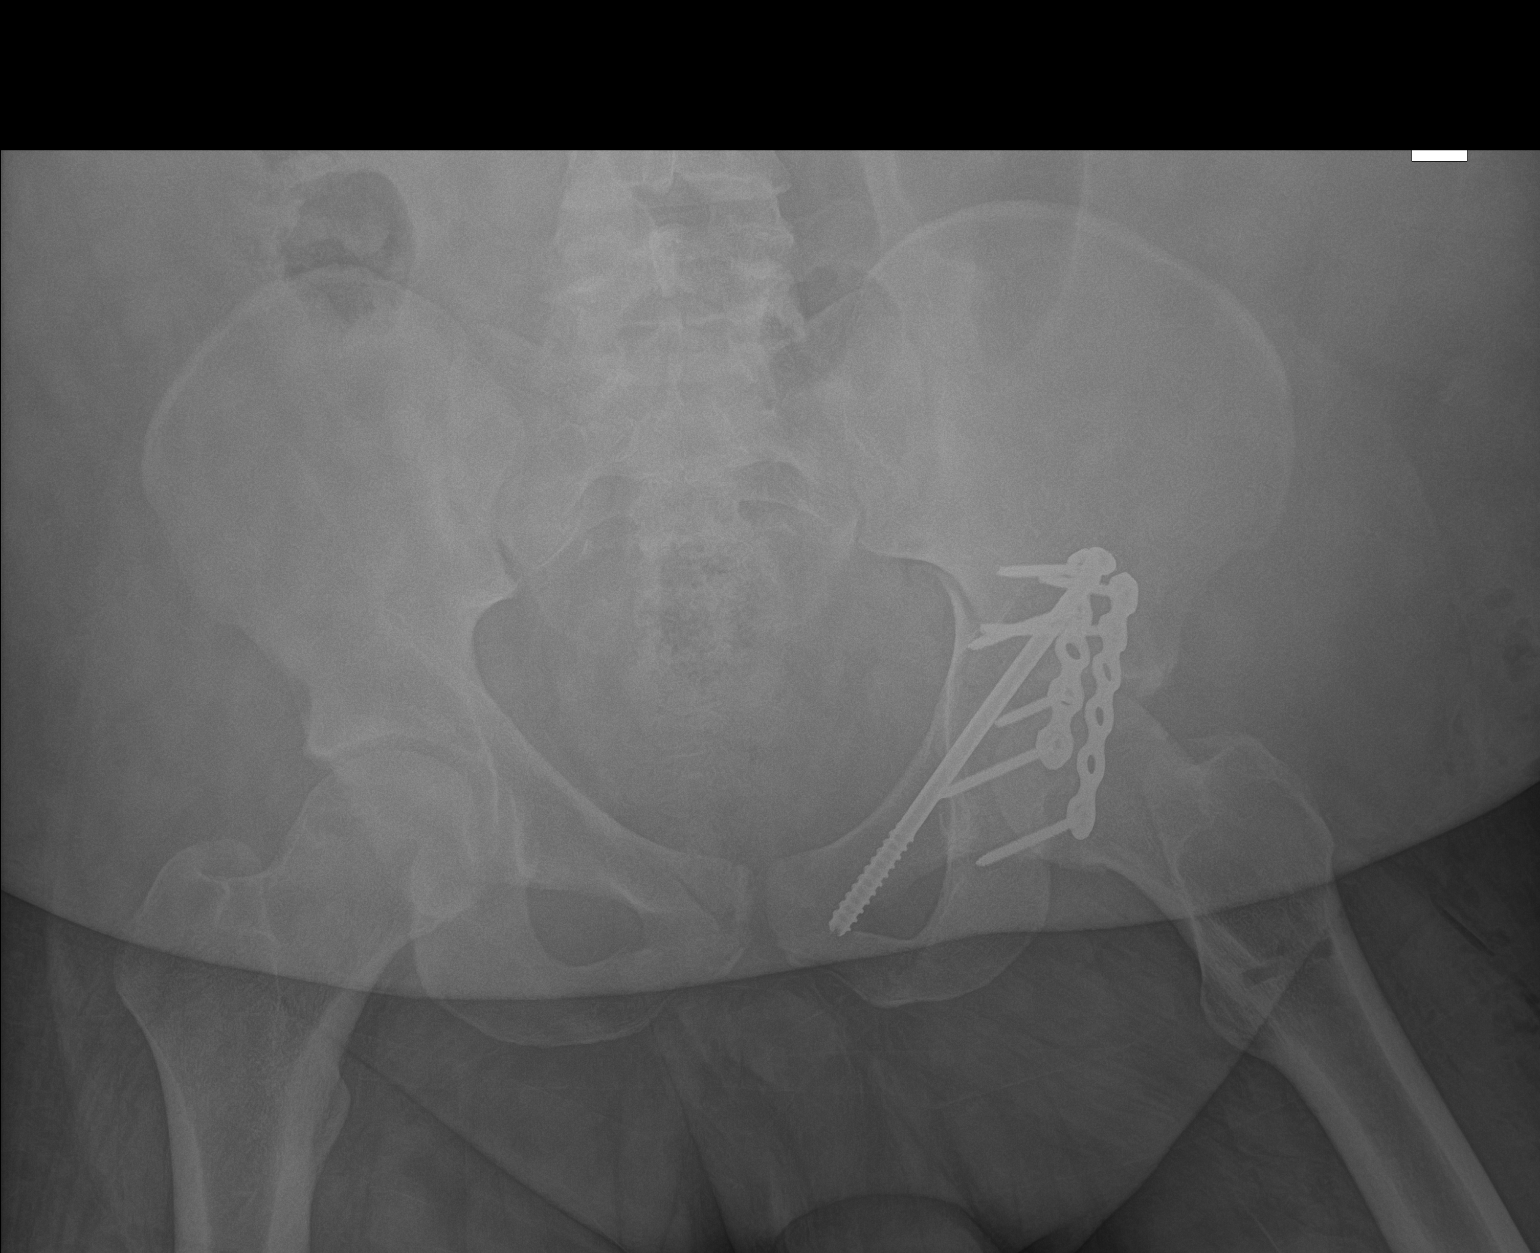

[pelvis obl (1 of 2)]
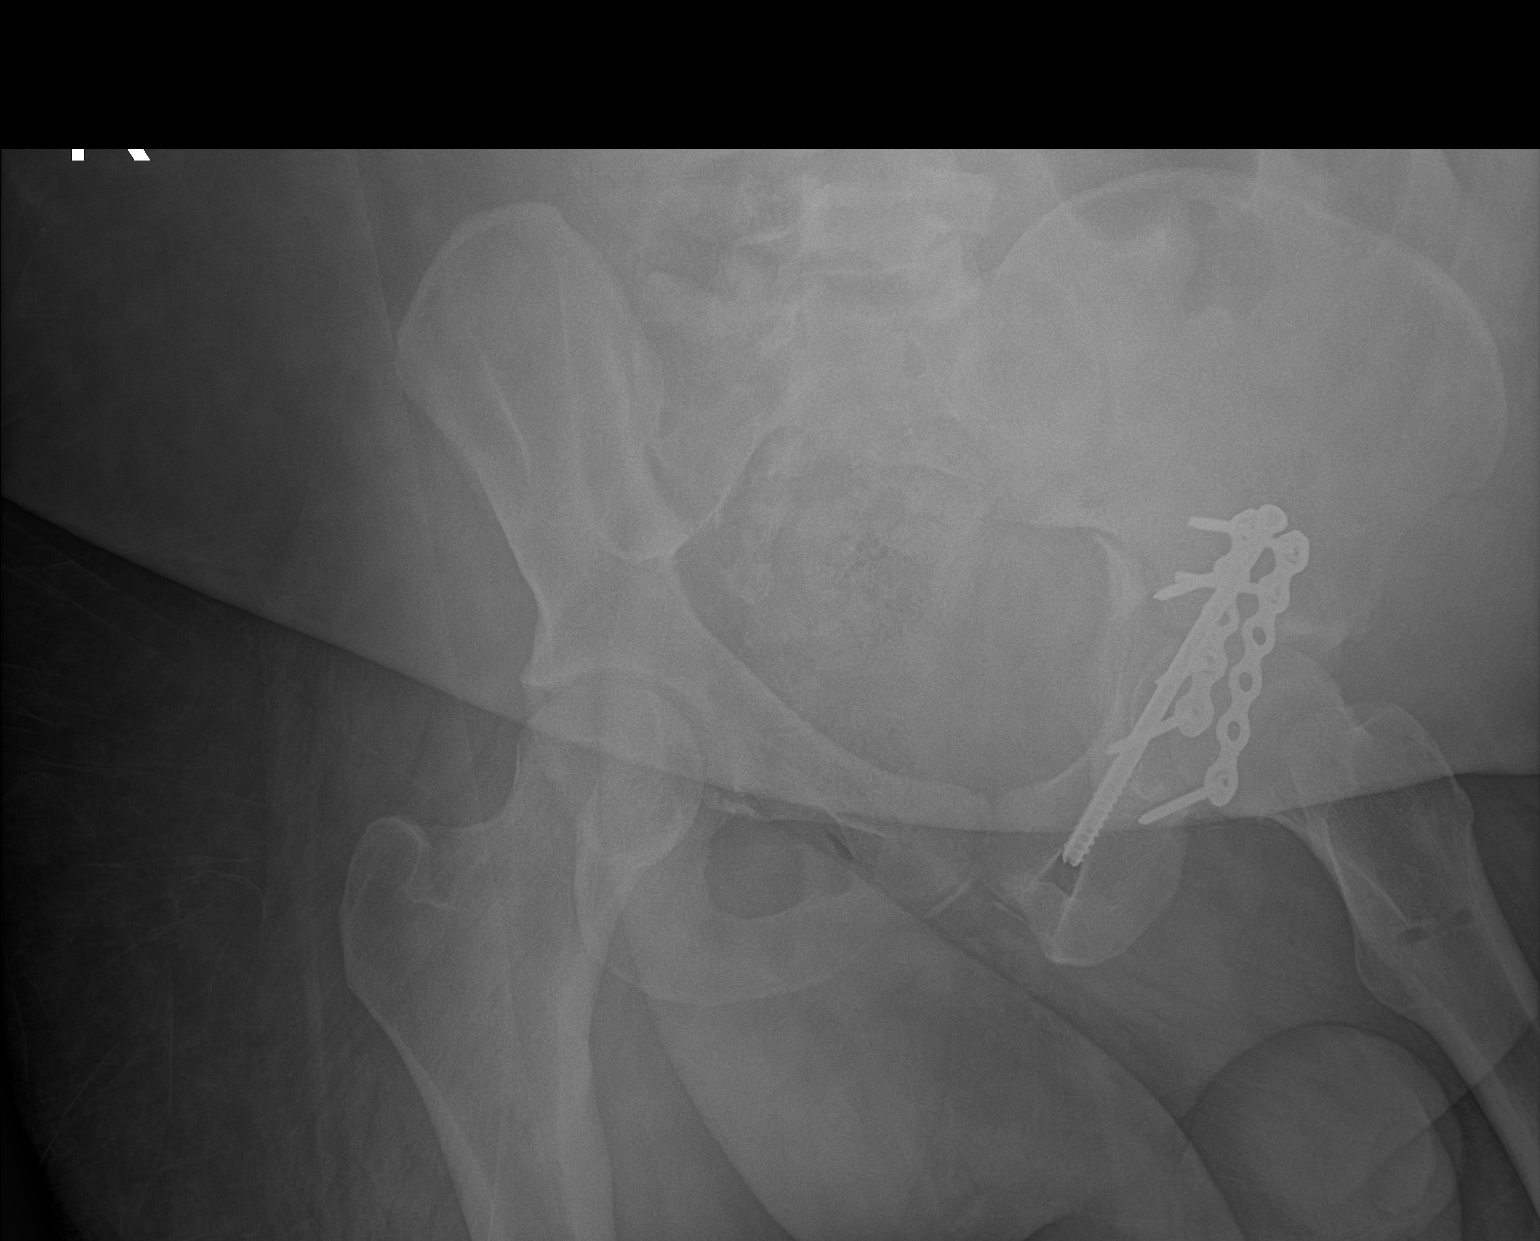

[pelvis obl (2 of 2)]
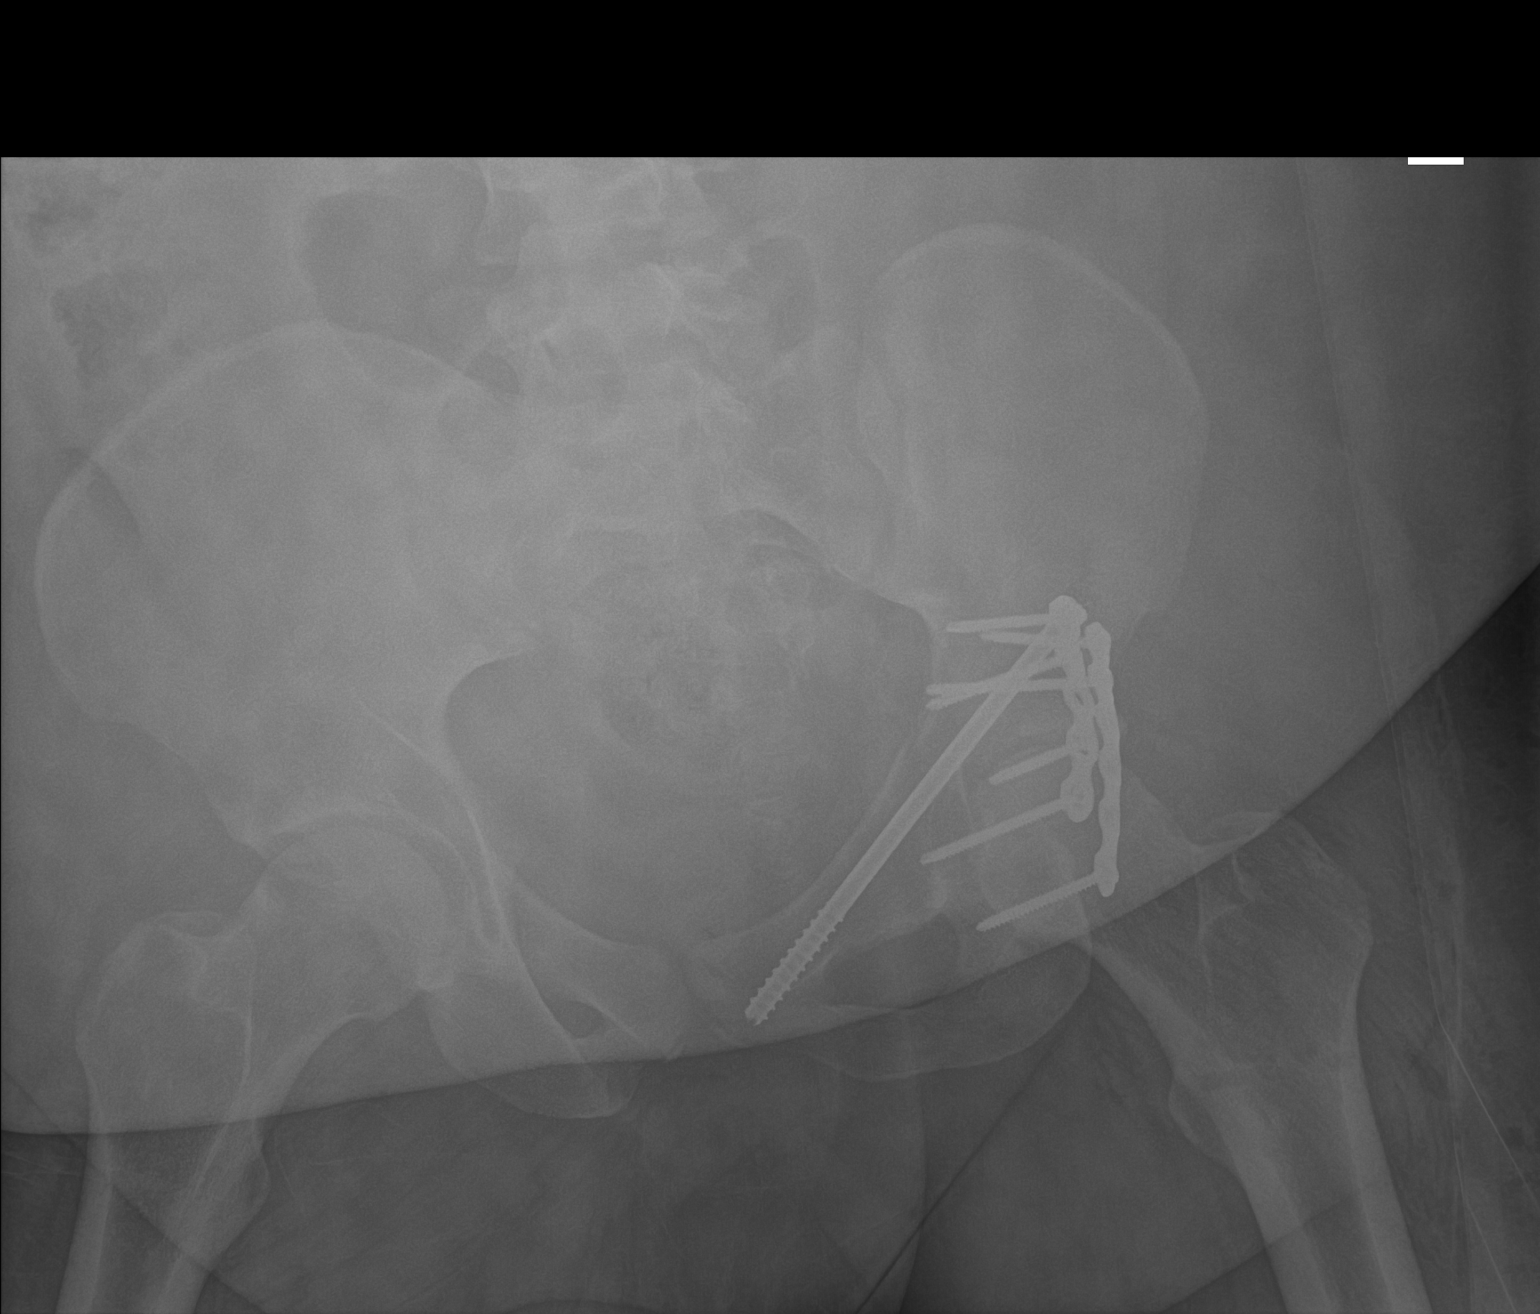

[3 of 3 positions shown; findings below may reference images not displayed]

FINDINGS: Status post ORIF of the left acetabular fracture with fixation
plates and screws. A longer screw traverses the acetabular fracture
and extends into the superior ramus of the left pubic bone adjacent
to the symphysis previous. The fracture fragments appear in near
anatomic alignment. No dislocation. The soft tissues are
unremarkable.
IMPRESSION: Status post ORIF of the left acetabular fracture.

## 2022-07-28 NOTE — Telephone Encounter (Signed)
Per Epic pt read my chart message

## 2022-09-29 ENCOUNTER — Ambulatory Visit: Payer: Self-pay | Admitting: Family Medicine

## 2023-10-02 ENCOUNTER — Ambulatory Visit: Admitting: Family Medicine

## 2023-10-05 ENCOUNTER — Ambulatory Visit (INDEPENDENT_AMBULATORY_CARE_PROVIDER_SITE_OTHER): Admitting: Family Medicine

## 2023-10-05 VITALS — BP 127/77 | HR 71 | Wt 375.6 lb

## 2023-10-05 DIAGNOSIS — E785 Hyperlipidemia, unspecified: Secondary | ICD-10-CM | POA: Diagnosis not present

## 2023-10-05 DIAGNOSIS — Z0001 Encounter for general adult medical examination with abnormal findings: Secondary | ICD-10-CM

## 2023-10-05 DIAGNOSIS — E119 Type 2 diabetes mellitus without complications: Secondary | ICD-10-CM

## 2023-10-05 DIAGNOSIS — Z0000 Encounter for general adult medical examination without abnormal findings: Secondary | ICD-10-CM

## 2023-10-05 DIAGNOSIS — E1169 Type 2 diabetes mellitus with other specified complication: Secondary | ICD-10-CM

## 2023-10-05 NOTE — Patient Instructions (Signed)
 Labs at your convenience.  Follow up in 6 months.

## 2023-10-06 LAB — LIPID PANEL
Chol/HDL Ratio: 4.5 ratio (ref 0.0–5.0)
Cholesterol, Total: 162 mg/dL (ref 100–199)
HDL: 36 mg/dL — ABNORMAL LOW (ref >39)
LDL Chol Calc (NIH): 114 mg/dL — ABNORMAL HIGH (ref 0–99)
Triglycerides: 59 mg/dL (ref 0–149)
VLDL Cholesterol Cal: 12 mg/dL (ref 5–40)

## 2023-10-06 LAB — CMP14+EGFR
ALT: 34 IU/L (ref 0–44)
AST: 23 IU/L (ref 0–40)
Albumin: 4.4 g/dL (ref 4.3–5.2)
Alkaline Phosphatase: 86 IU/L (ref 44–121)
BUN/Creatinine Ratio: 15 (ref 9–20)
BUN: 11 mg/dL (ref 6–20)
Bilirubin Total: 0.4 mg/dL (ref 0.0–1.2)
CO2: 23 mmol/L (ref 20–29)
Calcium: 9.9 mg/dL (ref 8.7–10.2)
Chloride: 104 mmol/L (ref 96–106)
Creatinine, Ser: 0.75 mg/dL — ABNORMAL LOW (ref 0.76–1.27)
Globulin, Total: 2.8 g/dL (ref 1.5–4.5)
Glucose: 106 mg/dL — ABNORMAL HIGH (ref 70–99)
Potassium: 4.7 mmol/L (ref 3.5–5.2)
Sodium: 142 mmol/L (ref 134–144)
Total Protein: 7.2 g/dL (ref 6.0–8.5)
eGFR: 131 mL/min/{1.73_m2} (ref >59)

## 2023-10-06 LAB — MICROALBUMIN / CREATININE URINE RATIO
Creatinine, Urine: 359.7 mg/dL
Microalb/Creat Ratio: 5 mg/g{creat} (ref 0–29)
Microalbumin, Urine: 17.5 ug/mL

## 2023-10-06 LAB — HEMOGLOBIN A1C
Est. average glucose Bld gHb Est-mCnc: 137 mg/dL
Hgb A1c MFr Bld: 6.4 % — ABNORMAL HIGH (ref 4.8–5.6)

## 2023-10-06 NOTE — Progress Notes (Unsigned)
 Subjective:  Patient ID: Travis Arias, male    DOB: 17-Oct-2000  Age: 23 y.o. MRN: [id number]  CC: Physical   HPI:  23 year old male with morbid obesity, hyperlipidemia, and type 2 diabetes (prior A1c 6.6 in 2021) presents for an annual exam.  Patient reports that overall he is doing well. However, he has ongoing pain around the nail of his left thumb. No injury. No relieving factors.   Due for labs and multiple preventative screenings.   Patient Active Problem List   Diagnosis Date Noted   Annual physical exam 10/08/2023   Hyperlipidemia 03/31/2022   Type 2 diabetes mellitus without complication, without long-term current use of insulin  (HCC) 08/26/2021   Morbid obesity (HCC) 08/26/2021    Social Hx   Social History   Socioeconomic History   Marital status: Single    Spouse name: Not on file   Number of children: Not on file   Years of education: Not on file   Highest education level: Some college, no degree  Occupational History   Not on file  Tobacco Use   Smoking status: Never   Smokeless tobacco: Never  Substance and Sexual Activity   Alcohol use: No   Drug use: No   Sexual activity: Not on file  Other Topics Concern   Not on file  Social History Narrative   Not on file   Social Drivers of Health   Financial Resource Strain: Low Risk  (10/05/2023)   Overall Financial Resource Strain (CARDIA)    Difficulty of Paying Living Expenses: Not very hard  Food Insecurity: No Food Insecurity (10/05/2023)   Hunger Vital Sign    Worried About Running Out of Food in the Last Year: Never true    Ran Out of Food in the Last Year: Never true  Transportation Needs: No Transportation Needs (10/05/2023)   PRAPARE - Administrator, Civil Service (Medical): No    Lack of Transportation (Non-Medical): No  Physical Activity: Insufficiently Active (10/05/2023)   Exercise Vital Sign    Days of Exercise per Week: 2 days    Minutes of Exercise per Session: 20 min   Stress: Stress Concern Present (10/05/2023)   [employer] of Occupational Health - Occupational Stress Questionnaire    Feeling of Stress : Rather much  Social Connections: Moderately Isolated (10/05/2023)   Social Connection and Isolation Panel [NHANES]    Frequency of Communication with Friends and Family: More than three times a week    Frequency of Social Gatherings with Friends and Family: Once a week    Attends Religious Services: 1 to 4 times per year    Active Member of [employer] or Organizations: No    Attends Engineer, structural: Not on file    Marital Status: Never married    Review of Systems  Constitutional: Negative.   Respiratory: Negative.    Cardiovascular: Negative.     Objective:  BP 127/77   Pulse 71   Wt (!) 375 lb 9.6 oz (170.4 kg)   SpO2 98%   BMI 58.83 kg/m      10/05/2023    3:10 PM 03/30/2022    3:17 PM 11/26/2021   10:11 AM  BP/Weight  Systolic BP 127 112 112  Diastolic BP 77 75 76  Wt. (Lbs) 375.6 352 350  BMI 58.83 kg/m2 55.13 kg/m2 54.82 kg/m2    Physical Exam Vitals and nursing note reviewed.  Constitutional:      General: He is not  in acute distress.    Appearance: Normal appearance. He is obese.  HENT:     Head: Normocephalic and atraumatic.  Eyes:     General:        Right eye: No discharge.        Left eye: No discharge.     Conjunctiva/sclera: Conjunctivae normal.  Cardiovascular:     Rate and Rhythm: Normal rate and regular rhythm.  Pulmonary:     Effort: Pulmonary effort is normal.     Breath sounds: Normal breath sounds. No wheezing, rhonchi or rales.  Musculoskeletal:     Comments: Normal exam of the left thumb.  Neurological:     Mental Status: He is alert.  Psychiatric:        Mood and Affect: Mood normal.        Behavior: Behavior normal.     Lab Results  Component Value Date   WBC 5.0 08/26/2021   HGB 14.5 08/26/2021   HCT 44.0 08/26/2021   PLT 458 (H) 08/26/2021   GLUCOSE 106 (H) 10/05/2023    CHOL 162 10/05/2023   TRIG 59 10/05/2023   HDL 36 (L) 10/05/2023   LDLCALC 114 (H) 10/05/2023   ALT 34 10/05/2023   AST 23 10/05/2023   NA 142 10/05/2023   K 4.7 10/05/2023   CL 104 10/05/2023   CREATININE 0.75 (L) 10/05/2023   BUN 11 10/05/2023   CO2 23 10/05/2023   TSH 1.650 07/31/2019   HGBA1C 6.4 (H) 10/05/2023     Assessment & Plan:  Annual physical exam Assessment & Plan: Unremarkable exam today.  Labs obtained.     Type 2 diabetes mellitus without complication, without long-term current use of insulin  (HCC) -     CMP14+EGFR -     Hemoglobin A1c -     Microalbumin / creatinine urine ratio  Hyperlipidemia, unspecified hyperlipidemia type -     Lipid panel    Follow-up:  No follow-ups on file.  Kathleen Papa DO [facility]

## 2023-10-08 ENCOUNTER — Encounter: Payer: Self-pay | Admitting: Family Medicine

## 2023-10-08 DIAGNOSIS — Z0000 Encounter for general adult medical examination without abnormal findings: Secondary | ICD-10-CM | POA: Insufficient documentation

## 2023-10-08 NOTE — Assessment & Plan Note (Addendum)
 Unremarkable exam today.  Labs obtained.

## 2023-10-10 ENCOUNTER — Other Ambulatory Visit: Payer: Self-pay | Admitting: Family Medicine

## 2023-10-15 ENCOUNTER — Encounter: Payer: Self-pay | Admitting: Family Medicine
